# Patient Record
Sex: Male | Born: 1995
Health system: Southern US, Community
[De-identification: ages and names within clinical notes are randomized; demographics above are authoritative.]

## PROBLEM LIST (undated history)

## (undated) ENCOUNTER — Emergency Department (HOSPITAL_COMMUNITY): Admission: EM | Discharge status: Against Medical Advice | Payer: Self-pay

---
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
## 2014-07-30 ENCOUNTER — Emergency Department (HOSPITAL_COMMUNITY)
Admission: EM | Admit: 2014-07-30 | Discharge: 2014-07-30 | Disposition: A | Discharge status: Home / Self Care | Payer: Self-pay | Source: Home / Self Care | Attending: Family Medicine | Admitting: Family Medicine

## 2014-07-30 ENCOUNTER — Encounter (HOSPITAL_COMMUNITY): Payer: Self-pay | Admitting: Emergency Medicine

## 2014-07-30 DIAGNOSIS — J020 Streptococcal pharyngitis: Secondary | ICD-10-CM

## 2014-07-30 LAB — POCT RAPID STREP A: STREPTOCOCCUS, GROUP A SCREEN (DIRECT): NEGATIVE

## 2014-07-30 MED ORDER — AMOXICILLIN 500 MG PO CAPS: 500.0000 mg | ORAL_CAPSULE | Freq: Two times a day (BID) | ORAL | Status: AC

## 2014-07-30 NOTE — ED Notes (Signed)
C/o  Sore throat. Trouble speaking.  Pt triaged and assessed by provider.

## 2014-07-30 NOTE — ED Provider Notes (Signed)
Alexander Perry is a 18 y.o. male who presents to Urgent Care today for sore throat. Patient is a 5 day history of severe sore throat. He's tried saltwater gargles and Chloraseptic spray. He notes a fever. No nausea or vomiting. No trouble breathing. Pain with speaking present.   History reviewed. No pertinent past medical history. History  Substance Use Topics  . Smoking status: Never Smoker   . Smokeless tobacco: Not on file  . Alcohol Use: No   ROS as above Medications: No current facility-administered medications for this encounter.   Current Outpatient Prescriptions  Medication Sig Dispense Refill  . amoxicillin (AMOXIL) 500 MG capsule Take 1 capsule (500 mg total) by mouth 2 (two) times daily.  40 capsule  0    Exam:  BP 134/82  Pulse 113  Temp(Src) 101.8 F (38.8 C) (Oral)  Resp 14  SpO2 99% Gen: Well NAD HEENT: EOMI,  MMM posterior pharynx with erythema and exudate bilaterally. Normal tympanic membranes. Lungs: Normal work of breathing. CTABL Heart: Tachycardia but regular no MRG Abd: NABS, Soft. Nondistended, Nontender Exts: Brisk capillary refill, warm and well perfused.   Results for orders placed during the hospital encounter of 07/30/14 (from the past 24 hour(s))  POCT RAPID STREP A ([facility]RG CARE ONLY)     Status: None   Collection Time    07/30/14  8:27 AM      Result Value Ref Range   Streptococcus, Group A Screen (Direct) NEGATIVE  NEGATIVE   No results found.  Assessment and Plan: 18 y.o. male with pharyngitis likely strep culture pending treatment with amoxicillin.  Discussed warning signs or symptoms. Please see discharge instructions. Patient expresses understanding.     Rodolph BongEvan S Johnay Mano, MD 07/30/14 [phone]

## 2014-07-30 NOTE — Discharge Instructions (Signed)
Thank you for coming in today. Call or go to the emergency room if you get worse, have trouble breathing, have chest pains, or palpitations.  Take amoxicillin twice daily for 10 days.   Pharyngitis Pharyngitis is redness, pain, and swelling (inflammation) of your pharynx.  CAUSES  Pharyngitis is usually caused by infection. Most of the time, these infections are from viruses (viral) and are part of a cold. However, sometimes pharyngitis is caused by bacteria (bacterial). Pharyngitis can also be caused by allergies. Viral pharyngitis may be spread from person to person by coughing, sneezing, and personal items or utensils (cups, forks, spoons, toothbrushes). Bacterial pharyngitis may be spread from person to person by more intimate contact, such as kissing.  SIGNS AND SYMPTOMS  Symptoms of pharyngitis include:   Sore throat.   Tiredness (fatigue).   Low-grade fever.   Headache.  Joint pain and muscle aches.  Skin rashes.  Swollen lymph nodes.  Plaque-like film on throat or tonsils (often seen with bacterial pharyngitis). DIAGNOSIS  Your health care provider will ask you questions about your illness and your symptoms. Your medical history, along with a physical exam, is often all that is needed to diagnose pharyngitis. Sometimes, a rapid strep test is done. Other lab tests may also be done, depending on the suspected cause.  TREATMENT  Viral pharyngitis will usually get better in 3-4 days without the use of medicine. Bacterial pharyngitis is treated with medicines that kill germs (antibiotics).  HOME CARE INSTRUCTIONS   Drink enough water and fluids to keep your urine clear or pale yellow.   Only take over-the-counter or prescription medicines as directed by your health care provider:   If you are prescribed antibiotics, make sure you finish them even if you start to feel better.   Do not take aspirin.   Get lots of rest.   Gargle with 8 oz of salt water ( tsp of  salt per 1 qt of water) as often as every 1-2 hours to soothe your throat.   Throat lozenges (if you are not at risk for choking) or sprays may be used to soothe your throat. SEEK MEDICAL CARE IF:   You have large, tender lumps in your neck.  You have a rash.  You cough up green, yellow-brown, or bloody spit. SEEK IMMEDIATE MEDICAL CARE IF:   Your neck becomes stiff.  You drool or are unable to swallow liquids.  You vomit or are unable to keep medicines or liquids down.  You have severe pain that does not go away with the use of recommended medicines.  You have trouble breathing (not caused by a stuffy nose). MAKE SURE YOU:   Understand these instructions.  Will watch your condition.  Will get help right away if you are not doing well or get worse. Document Released: 09/25/2005 Document Revised: 07/16/2013 Document Reviewed: 06/02/2013 [facility] Patient Information 2015 [place], [place]. This information is not intended to replace advice given to you by your health care provider. Make sure you discuss any questions you have with your health care provider.

## 2014-08-03 LAB — CULTURE, GROUP A STREP

## 2014-08-04 ENCOUNTER — Telehealth (HOSPITAL_COMMUNITY): Payer: Self-pay | Admitting: *Deleted

## 2014-08-04 NOTE — ED Notes (Signed)
Throat culture: Strep beta hemolytic not group A.  Pt. adequately treated with Amoxicillin.  I called and left a message to call.  Call 1.  Mom called back and asked her to have him call me. Vassie MoselleYork, Brennyn Ortlieb M 08/04/2014

## 2014-08-07 NOTE — ED Notes (Signed)
Unable to reach pt. By phone x 3.  Confidential marked letter sent with results and instructions. Vassie MoselleYork, Jamela Cumbo M 08/07/2014

## 2014-11-24 ENCOUNTER — Ambulatory Visit (INDEPENDENT_AMBULATORY_CARE_PROVIDER_SITE_OTHER): Payer: BLUE CROSS/BLUE SHIELD | Admitting: Family Medicine

## 2014-11-24 ENCOUNTER — Encounter: Payer: Self-pay | Admitting: Family Medicine

## 2014-11-24 VITALS — BP 113/71 | HR 71 | Temp 98.5°F | Resp 16 | Ht 68.0 in | Wt 179.0 lb

## 2014-11-24 DIAGNOSIS — S161XXA Strain of muscle, fascia and tendon at neck level, initial encounter: Secondary | ICD-10-CM

## 2014-11-24 NOTE — Patient Instructions (Signed)
Can take ibuprofen 2-3 tablets every 8 hours as needed for pain Do gentle range of motion/stretching exercises several times a day If your pain is worse or you develop numbness/tingling/weakness- return to see us  Muscle Strain A muscle strain is an injury that occurs when a muscle is stretched beyond its normal length. Usually a small number of muscle fibers are torn when this happens. Muscle strain is rated in degrees. First-degree strains have the least amount of muscle fiber tearing and pain. Second-degree and third-degree strains have increasingly more tearing and pain.  Usually, recovery from muscle strain takes 1-2 weeks. Complete healing takes 5-6 weeks.  CAUSES  Muscle strain happens when a sudden, violent force placed on a muscle stretches it too far. This may occur with lifting, sports, or a fall.  RISK FACTORS Muscle strain is especially common in athletes.  SIGNS AND SYMPTOMS At the site of the muscle strain, there may be:  Pain.  Bruising.  Swelling.  Difficulty using the muscle due to pain or lack of normal function. DIAGNOSIS  Your health care provider will perform a physical exam and ask about your medical history. TREATMENT  Often, the best treatment for a muscle strain is resting, icing, and applying cold compresses to the injured area.  HOME CARE INSTRUCTIONS   Use the PRICE method of treatment to promote muscle healing during the first 2-3 days after your injury. The PRICE method involves:  Protecting the muscle from being injured again.  Restricting your activity and resting the injured body part.  Icing your injury. To do this, put ice in a plastic bag. Place a towel between your skin and the bag. Then, apply the ice and leave it on from 15-20 minutes each hour. After the third day, switch to moist heat packs.  Apply compression to the injured area with a splint or elastic bandage. Be careful not to wrap it too tightly. This may interfere with blood  circulation or increase swelling.  Elevate the injured body part above the level of your heart as often as you can.  Only take over-the-counter or prescription medicines for pain, discomfort, or fever as directed by your health care provider.  Warming up prior to exercise helps to prevent future muscle strains. SEEK MEDICAL CARE IF:   You have increasing pain or swelling in the injured area.  You have numbness, tingling, or a significant loss of strength in the injured area. MAKE SURE YOU:   Understand these instructions.  Will watch your condition.  Will get help right away if you are not doing well or get worse. Document Released: 09/25/2005 Document Revised: 07/16/2013 Document Reviewed: 04/24/2013 [facility] Patient Information 2015 [place], [place]. This information is not intended to replace advice given to you by your health care provider. Make sure you discuss any questions you have with your health care provider.
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
## 2022-06-09 ENCOUNTER — Ambulatory Visit: Payer: PRIVATE HEALTH INSURANCE

## 2022-06-09 ENCOUNTER — Inpatient Hospital Stay
Admit: 2022-06-09 | Discharge: 2022-06-10 | Disposition: A | Discharge status: Home / Self Care | Payer: PRIVATE HEALTH INSURANCE | Source: Home / Self Care

## 2022-06-09 DIAGNOSIS — S93409A Sprain of unspecified ligament of unspecified ankle, initial encounter: Secondary | ICD-10-CM

## 2022-06-09 NOTE — Discharge Instructions
Emergency Department Discharge Instructions      Summary of your visit  You have been evaluated in the [facility] Emergency Department today for ankle pain.  Your evaluation included a physical exam and X-ray(s).   We don't see any signs of concerning conditions such as fractures or dislocations.  Your ankle has been ace wrapped, and we have provided crutches for you to use while your ankle heals.     Treatment for musculoskeletal pain  To reduce pain and swelling, you can:   Rest the injured area as needed. You may need to avoid putting weight on it for extensive periods.  Use cold packs. Putting an ice pack over the injured area may help reduce swelling and pain. To make an ice pack, put ice cubes in a plastic bag that seals at the top. Wrap the bag in a clean, thin towel or cloth. Don't put ice or an ice pack directly on the skin.  Raise/elevate your injured body part above your heart. Propping up the body part so that it is above your heart may ease swelling.  Compression. An elastic bandage may be wrapped around the area to help reduce swelling and give some support to the area.   Stretching and other exercises. These improve flexibility and strength.  Heat packs. These may be recommended before doing exercises.    Medications  We recommend you take 600mg  ibuprofen every 6 hours or tylenol 650mg  every 6 hours as needed for pain. If needed, you can alternate these medications so that you take one medication every 3 hours. For instance, at noon take ibuprofen, then at 3pm take tylenol, then at 6pm take ibuprofen.     Talk with your healthcare provider before taking any medicines if you have a chronic condition such as diabetes, liver or kidney disease, stomach ulcers, or digestive bleeding, or are taking blood thinners.     Follow-Up  Please follow-up with an orthopedic surgeon in 1 week.   You can call [phone] to make an appointment with an orthopedic surgeon at [facility].  Please follow up with your primary care physician within three days after being discharged from the ER - you can call to schedule an appointment.  You can find a primary care physician at [facility] by calling [phone].    If you do not have a Primary Care Physician, please call your insurance company or you may call [phone] to establish care with a [facility] physician.  If you are uninsured, please call 2-1-1 to find a free or low-cost clinic in your area. 2-1-1 [place] is the central source for providing information and referrals for all health and human services in [facility]. Our 2-1-1 phone line is open 24 hours, 7 days a week, with trained [employer] prepared to offer help with any situation, any time. Our community services go far beyond phone referrals - explore our website to learn more. If you are calling from outside [facility] or cannot directly dial 2-1-1, you can call [phone].      Return to the Emergency Department if you experience:  Fevers 100.4 ?F or greater  Worsening or uncontrolled pain  Numbness, tingling, or coldness in your foot or toes  Toenails that turn blue or grey in color  Fainting  Any other concerning symptoms     Thank you for choosing [facility] for your care. It was a pleasure taking part in your care today, and we wish you the best!
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
## 2022-10-05 ENCOUNTER — Telehealth: Payer: Self-pay | Admitting: Family Medicine

## 2022-10-05 NOTE — Telephone Encounter (Signed)
Pt scheduled a STI appt for 10/11/2022 but has some questions regarding the different tests included in a whole panel visit, also about how long will the results be available. Please call him back, thanks.

## 2022-10-05 NOTE — Telephone Encounter (Signed)
Pt scheduled a STI appt for 10/11/2022 but has some questions regarding the different tests included in a whole panel visit, also about how long will the results be available. Please call him back, thanks. 

## 2022-10-11 ENCOUNTER — Ambulatory Visit: Payer: Self-pay
